# Patient Record
Sex: Male | Born: 1990 | Race: Black or African American | Hispanic: No | Marital: Married | Smoking: Never smoker
Health system: Southern US, Community
[De-identification: ages and names within clinical notes are randomized; demographics above are authoritative.]

## PROBLEM LIST (undated history)

## (undated) DIAGNOSIS — F819 Developmental disorder of scholastic skills, unspecified: Secondary | ICD-10-CM

## (undated) HISTORY — PX: NO PAST SURGERIES: SHX2092

## (undated) HISTORY — DX: Developmental disorder of scholastic skills, unspecified: F81.9

---
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
## 2015-04-28 ENCOUNTER — Ambulatory Visit: Payer: 59 | Admitting: Family Medicine

## 2015-05-08 ENCOUNTER — Encounter: Payer: Self-pay | Admitting: Family Medicine

## 2015-05-08 ENCOUNTER — Ambulatory Visit (INDEPENDENT_AMBULATORY_CARE_PROVIDER_SITE_OTHER): Payer: 59 | Admitting: Family Medicine

## 2015-05-08 VITALS — BP 118/82 | HR 61 | Temp 97.3°F | Ht 72.5 in | Wt 278.8 lb

## 2015-05-08 DIAGNOSIS — Z13220 Encounter for screening for lipoid disorders: Secondary | ICD-10-CM

## 2015-05-08 DIAGNOSIS — Z23 Encounter for immunization: Secondary | ICD-10-CM

## 2015-05-08 DIAGNOSIS — E669 Obesity, unspecified: Secondary | ICD-10-CM | POA: Diagnosis not present

## 2015-05-08 DIAGNOSIS — Z0000 Encounter for general adult medical examination without abnormal findings: Secondary | ICD-10-CM | POA: Diagnosis not present

## 2015-05-08 DIAGNOSIS — F70 Mild intellectual disabilities: Secondary | ICD-10-CM | POA: Insufficient documentation

## 2015-05-08 DIAGNOSIS — Z130 Encounter for screening for diseases of the blood and blood-forming organs and certain disorders involving the immune mechanism: Secondary | ICD-10-CM | POA: Diagnosis not present

## 2015-05-08 DIAGNOSIS — F79 Unspecified intellectual disabilities: Secondary | ICD-10-CM

## 2015-05-08 LAB — LIPID PANEL
CHOLESTEROL: 212 mg/dL — AB (ref 0–200)
HDL: 56 mg/dL (ref >39.00)
LDL Cholesterol: 146 mg/dL — ABNORMAL HIGH (ref 0–99)
NonHDL: 155.89
Total CHOL/HDL Ratio: 4
Triglycerides: 51 mg/dL (ref 0.0–149.0)
VLDL: 10.2 mg/dL (ref 0.0–40.0)

## 2015-05-08 LAB — CBC
HCT: 50.2 % (ref 39.0–52.0)
HEMOGLOBIN: 16.2 g/dL (ref 13.0–17.0)
MCHC: 32.2 g/dL (ref 30.0–36.0)
MCV: 78.7 fl (ref 78.0–100.0)
Platelets: 182 10*3/uL (ref 150.0–400.0)
RBC: 6.38 Mil/uL — ABNORMAL HIGH (ref 4.22–5.81)
RDW: 13.8 % (ref 11.5–15.5)
WBC: 5.9 10*3/uL (ref 4.0–10.5)

## 2015-05-08 LAB — COMPREHENSIVE METABOLIC PANEL
ALK PHOS: 60 U/L (ref 39–117)
ALT: 49 U/L (ref 0–53)
AST: 30 U/L (ref 0–37)
Albumin: 4.2 g/dL (ref 3.5–5.2)
BILIRUBIN TOTAL: 0.9 mg/dL (ref 0.2–1.2)
BUN: 19 mg/dL (ref 6–23)
CO2: 29 mEq/L (ref 19–32)
Calcium: 9.7 mg/dL (ref 8.4–10.5)
Chloride: 103 mEq/L (ref 96–112)
Creatinine, Ser: 1.06 mg/dL (ref 0.40–1.50)
GFR: 107.9 mL/min (ref >60.00)
GLUCOSE: 92 mg/dL (ref 70–99)
Potassium: 3.6 mEq/L (ref 3.5–5.1)
SODIUM: 139 meq/L (ref 135–145)
TOTAL PROTEIN: 7.2 g/dL (ref 6.0–8.3)

## 2015-05-08 LAB — HEMOGLOBIN A1C: HEMOGLOBIN A1C: 5.5 % (ref 4.6–6.5)

## 2015-05-08 NOTE — Assessment & Plan Note (Signed)
Influenza vaccine up-to-date. Tdap given today. Screening labs today: CBC, CMP, lipid, A1c.

## 2015-05-08 NOTE — Patient Instructions (Addendum)
It was nice to see you today.  Your exam was normal.  Follow up:  Return in about 27 year (around 27/05/2016).   Call with any concerns.  Take care  Dr. Adriana Simas  Health Maintenance, Male A healthy lifestyle and preventative care can promote health and wellness.  Maintain regular health, dental, and eye exams.  Eat a healthy diet. Foods like vegetables, fruits, whole grains, low-fat dairy products, and lean protein foods contain the nutrients you need and are low in calories. Decrease your intake of foods high in solid fats, added sugars, and salt. Get information about a proper diet from your health care provider, if necessary.  Regular physical exercise is one of the most important things you can do for your health. Most adults should get at least 150 minutes of moderate-intensity exercise (any activity that increases your heart rate and causes you to sweat) each week. In addition, most adults need muscle-strengthening exercises on 2 or more days a week.   Maintain a healthy weight. The body mass index (BMI) is a screening tool to identify possible weight problems. It provides an estimate of body fat based on height and weight. Your health care provider can find your BMI and can help you achieve or maintain a healthy weight. For males 20 years and older:  A BMI below 18.5 is considered underweight.  A BMI of 18.5 to 24.9 is normal.  A BMI of 25 to 29.9 is considered overweight.  A BMI of 30 and above is considered obese.  Maintain normal blood lipids and cholesterol by exercising and minimizing your intake of saturated fat. Eat a balanced diet with plenty of fruits and vegetables. Blood tests for lipids and cholesterol should begin at age 27 and be repeated every 5 years. If your lipid or cholesterol levels are high, you are over age 72, or you are at high risk for heart disease, you may need your cholesterol levels checked more frequently.Ongoing high lipid and cholesterol levels should  be treated with medicines if diet and exercise are not working.  If you smoke, find out from your health care provider how to quit. If you do not use tobacco, do not start.  Lung cancer screening is recommended for adults aged 55-80 years who are at high risk for developing lung cancer because of a history of smoking. A yearly low-dose CT scan of the lungs is recommended for people who have at least a 30-pack-year history of smoking and are current smokers or have quit within the past 15 years. A pack year of smoking is smoking an average of 1 pack of cigarettes a day for 1 year (for example, a 30-pack-year history of smoking could mean smoking 1 pack a day for 30 years or 2 packs a day for 15 years). Yearly screening should continue until the smoker has stopped smoking for at least 15 years. Yearly screening should be stopped for people who develop a health problem that would prevent them from having lung cancer treatment.  If you choose to drink alcohol, do not have more than 2 drinks per day. One drink is considered to be 12 oz (360 mL) of beer, 5 oz (150 mL) of wine, or 1.5 oz (45 mL) of liquor.  Avoid the use of street drugs. Do not share needles with anyone. Ask for help if you need support or instructions about stopping the use of drugs.  High blood pressure causes heart disease and increases the risk of stroke. High blood pressure  is more likely to develop in:  People who have blood pressure in the end of the normal range (100-139/85-89 mm Hg).  People who are overweight or obese.  People who are African American.  If you are 22-62 years of age, have your blood pressure checked every 3-5 years. If you are 17 years of age or older, have your blood pressure checked every year. You should have your blood pressure measured twice--once when you are at a hospital or clinic, and once when you are not at a hospital or clinic. Record the average of the two measurements. To check your blood pressure  when you are not at a hospital or clinic, you can use:  An automated blood pressure machine at a pharmacy.  A home blood pressure monitor.  If you are 75-75 years old, ask your health care provider if you should take aspirin to prevent heart disease.  Diabetes screening involves taking a blood sample to check your fasting blood sugar level. This should be done once every 3 years after age 1 if you are at a normal weight and without risk factors for diabetes. Testing should be considered at a younger age or be carried out more frequently if you are overweight and have at least 1 risk factor for diabetes.  Colorectal cancer can be detected and often prevented. Most routine colorectal cancer screening begins at the age of 76 and continues through age 43. However, your health care provider may recommend screening at an earlier age if you have risk factors for colon cancer. On a yearly basis, your health care provider may provide home test kits to check for hidden blood in the stool. A small camera at the end of a tube may be used to directly examine the colon (sigmoidoscopy or colonoscopy) to detect the earliest forms of colorectal cancer. Talk to your health care provider about this at age 39 when routine screening begins. A direct exam of the colon should be repeated every 5-10 years through age 104, unless early forms of precancerous polyps or small growths are found.  People who are at an increased risk for hepatitis B should be screened for this virus. You are considered at high risk for hepatitis B if:  You were born in a country where hepatitis B occurs often. Talk with your health care provider about which countries are considered high risk.  Your parents were born in a high-risk country and you have not received a shot to protect against hepatitis B (hepatitis B vaccine).  You have HIV or AIDS.  You use needles to inject street drugs.  You live with, or have sex with, someone who has  hepatitis B.  You are a man who has sex with other men (MSM).  You get hemodialysis treatment.  You take certain medicines for conditions like cancer, organ transplantation, and autoimmune conditions.  Hepatitis C blood testing is recommended for all people born from [birth year] through 1967 and any individual with known risk factors for hepatitis C.  Healthy men should no longer receive prostate-specific antigen (PSA) blood tests as part of routine cancer screening. Talk to your health care provider about prostate cancer screening.  Testicular cancer screening is not recommended for adolescents or adult males who have no symptoms. Screening includes self-exam, a health care provider exam, and other screening tests. Consult with your health care provider about any symptoms you have or any concerns you have about testicular cancer.  Practice safe sex. Use condoms and avoid high-risk  sexual practices to reduce the spread of sexually transmitted infections (STIs).  You should be screened for STIs, including gonorrhea and chlamydia if:  You are sexually active and are younger than 24 years.  You are older than 24 years, and your health care provider tells you that you are at risk for this type of infection.  Your sexual activity has changed since you were last screened, and you are at an increased risk for chlamydia or gonorrhea. Ask your health care provider if you are at risk.  If you are at risk of being infected with HIV, it is recommended that you take a prescription medicine daily to prevent HIV infection. This is called pre-exposure prophylaxis (PrEP). You are considered at risk if:  You are a man who has sex with other men (MSM).  You are a heterosexual man who is sexually active with multiple partners.  You take drugs by injection.  You are sexually active with a partner who has HIV.  Talk with your health care provider about whether you are at high risk of being infected with HIV. If  you choose to begin PrEP, you should first be tested for HIV. You should then be tested every 3 months for as long as you are taking PrEP.  Use sunscreen. Apply sunscreen liberally and repeatedly throughout the day. You should seek shade when your shadow is shorter than you. Protect yourself by wearing long sleeves, pants, a wide-brimmed hat, and sunglasses year round whenever you are outdoors.  Tell your health care provider of new moles or changes in moles, especially if there is a change in shape or color. Also, tell your health care provider if a mole is larger than the size of a pencil eraser.  A one-time screening for abdominal aortic aneurysm (AAA) and surgical repair of large AAAs by ultrasound is recommended for men aged 63-75 years who are current or former smokers.  Stay current with your vaccines (immunizations).   This information is not intended to replace advice given to you by your health care provider. Make sure you discuss any questions you have with your health care provider.   Document Released: 09/18/2007 Document Revised: 04/12/2014 Document Reviewed: 08/17/2010 Elsevier Interactive Patient Education [employer].

## 2015-05-08 NOTE — Progress Notes (Signed)
Subjective:  Patient ID: Johnny Rasmussen, male    DOB: Jan 23, 1991  Age: 25 y.o. MRN: [id number]  CC: Establish care.  HPI Johnny Rasmussen is a 25 y.o. male presents to the clinic today to establish care. No current concerns.  Preventative Healthcare  Immunizations  Tetanus - in need of.  Pneumococcal - not indicated.  Flu - up-to-date.  Labs: Screening labs today.   Exercise: No regular exercise.   Alcohol use: See below.  Smoking/tobacco use: No.   STD/HIV testing: Not sexually active.  PMH, Surgical Hx, Family Hx, Social History reviewed and updated as below.  Past Medical History  Diagnosis Date  . 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 No past surgeries     Family History  Problem Relation Age of Onset  . Hyperlipidemia Mother   . Hypertension Mother   . Diabetes Mother   . Drug abuse Paternal Uncle   . Hyperlipidemia Maternal Grandfather   . Diabetes Maternal Grandfather    Social History  Substance Use Topics  . Smoking status: Never Smoker   . Smokeless tobacco: Never Used  . Alcohol Use: 1.8 oz/week    3 Standard drinks or equivalent per week   Review of Systems  Respiratory: Positive for cough.   Gastrointestinal: Positive for diarrhea.  Neurological: Positive for dizziness.  Psychiatric/Behavioral:       Stress.   Objective:   Today's Vitals: BP 118/82 mmHg  Pulse 61  Temp(Src) 97.3 F (36.3 C) (Oral)  Ht 6' 0.5" (1.842 m)  Wt 278 lb 12 oz (126.44 kg)  BMI 37.27 kg/m2  SpO2 96%  Physical Exam  Constitutional: He is oriented to person, place, and time. He appears well-developed and well-nourished. No distress.  HENT:  Head: Normocephalic and atraumatic.  Nose: Nose normal.  Mouth/Throat: Oropharynx is clear and moist. No oropharyngeal exudate.  Normal TM's bilaterally.   Eyes: Conjunctivae are normal. No scleral icterus.  Neck: Neck supple.  No thyromegaly present.  Cardiovascular: Normal rate and regular rhythm.   No murmur heard. Pulmonary/Chest: Effort normal and breath sounds normal. He has no wheezes. He has no rales.  Abdominal: Soft. He exhibits no distension. There is no tenderness. There is no rebound and no guarding.  Musculoskeletal: Normal range of motion. He exhibits no edema.  Lymphadenopathy:    He has no cervical adenopathy.  Neurological: He is alert and oriented to person, place, and time.  Skin: Skin is warm and dry. No rash noted.  Psychiatric: He has a normal mood and affect.  Vitals reviewed.  Assessment & Plan:   Problem List Items Addressed This Visit    Intellectual disability   Preventative health care - Primary    Influenza vaccine up-to-date. Tdap given today. Screening labs today: CBC, CMP, lipid, A1c.       Other Visit Diagnoses    Screening, lipid        Relevant Orders    Lipid Profile    Comp Met (CMET)    Obesity (BMI 35.0-39.9 without comorbidity) ([place])        Relevant Orders    HgB A1c    Screening for deficiency anemia        Relevant Orders    CBC    Need for prophylactic vaccination with combined diphtheria-tetanus-pertussis (DTP) vaccine        Relevant Orders  Tdap vaccine greater than or equal to 7yo IM (Completed)       No outpatient encounter prescriptions on file as of 05/08/2015.   No facility-administered encounter medications on file as of 05/08/2015.    Follow-up: Return in about 1 year (around 05/07/2016).  [place]

## 2015-05-08 NOTE — Progress Notes (Signed)
Pre visit review using our clinic review tool, if applicable. No additional management support is needed unless otherwise documented below in the visit note. 

## 2019-01-18 ENCOUNTER — Other Ambulatory Visit: Payer: Self-pay

## 2019-01-18 ENCOUNTER — Emergency Department: Payer: Medicaid Other

## 2019-01-18 ENCOUNTER — Encounter: Payer: Self-pay | Admitting: Emergency Medicine

## 2019-01-18 ENCOUNTER — Emergency Department
Admission: EM | Admit: 2019-01-18 | Discharge: 2019-01-18 | Disposition: A | Discharge status: Home / Self Care | Payer: Medicaid Other | Attending: Emergency Medicine | Admitting: Emergency Medicine

## 2019-01-18 DIAGNOSIS — R0781 Pleurodynia: Secondary | ICD-10-CM | POA: Insufficient documentation

## 2019-01-18 MED ORDER — MELOXICAM 15 MG PO TABS: 15.0000 mg | ORAL_TABLET | Freq: Every day | ORAL | 0 refills | Status: DC

## 2019-01-18 MED ORDER — NAPROXEN 500 MG PO TABS
500.0000 mg | ORAL_TABLET | Freq: Once | ORAL | Status: AC
  Administered 2019-01-18: 18:00:00 500 mg via ORAL
  Filled 2019-01-18: qty 1

## 2019-01-18 NOTE — ED Triage Notes (Signed)
Pt in via POV, reports left rib pain x 3 weeks.  Pt reports working at [employer], states, "I dont know if I pulled something or what."  Denies any known injury.  NAD noted at this time.

## 2019-01-18 NOTE — ED Provider Notes (Signed)
[facility] Emergency Department Provider Note  ____________________________________________   First MD Initiated Contact with Patient 01/18/19 1754     (approximate)  I have reviewed the triage vital signs and the nursing notes.   HISTORY  Chief Complaint Chest Pain   HPI Johnny Rasmussen is a 28 y.o. male who presents to the emergency department for treatment and evaluation of pain in the left rib for the past 3 weeks.  No specific injury.  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 No relief with muscle rub cream.    Past Medical History:  Diagnosis Date  . 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 Intellectual disability 05/08/2015  . Preventative health care 05/08/2015    Past Surgical History:  Procedure Laterality Date  . NO PAST SURGERIES      Prior to Admission medications   Medication Sig Start Date End Date Taking? Authorizing Provider  meloxicam (MOBIC) 15 MG tablet Take 1 tablet (15 mg total) by mouth daily. 01/18/19   Chinita Pester, FNP    Allergies Patient has no known allergies.  Family History  Problem Relation Age of Onset  . Hyperlipidemia Mother   . Hypertension Mother   . Diabetes Mother   . Drug abuse Paternal Uncle   . Hyperlipidemia Maternal Grandfather   . Diabetes Maternal Grandfather     Social History Social History   Tobacco Use  . Smoking status: Never Smoker  . Smokeless tobacco: Never Used  Substance Use Topics  . Alcohol use: Yes    Alcohol/week: 3.0 standard drinks    Types: 3 Standard drinks or equivalent per week  . Drug use: No    Review of Systems  Constitutional: No fever/chills. Eyes: No visual changes. ENT: No sore throat. Cardiovascular: Positive for chest/left rib pain. Negative for pleuritic pain. Negative for palpitations. Negative for leg pain.  Respiratory: Negative for shortness of breath. Gastrointestinal: Negative for abdominal pain. no nausea, no vomiting.  No diarrhea.  No constipation. Genitourinary: Negative for dysuria. Musculoskeletal: Positive for back pain.  Skin: Negative for rash, lesion, wound. Neurological: Negative for headaches, focal weakness or numbness.   ____________________________________________   PHYSICAL EXAM:  VITAL SIGNS: ED Triage Vitals  Enc Vitals Group     BP 01/18/19 1737 (!) 171/96     Pulse Rate 01/18/19 1737 78     Resp 01/18/19 1737 16     Temp 01/18/19 1737 98.6 F (37 C)     Temp Source 01/18/19 1737 Oral     SpO2 01/18/19 1737 98 %     Weight 01/18/19 1741 260 lb (117.9 kg)     Height 01/18/19 1741 5\' 10"  (1.778 m)     Head Circumference --      Peak Flow --      Pain Score 01/18/19 1740 7     Pain Loc --      Pain Edu? --      Excl. in GC? --     Constitutional: Alert and oriented. Well appearing and in no acute distress. Normal mental status. Eyes: Conjunctivae are normal. PERRL. Head: Atraumatic. Nose: No congestion/rhinnorhea. Mouth/Throat: Mucous membranes are moist.  Oropharynx non-erythematous. Tongue normal in size and color. Neck: No stridor.  Hematological/Lymphatic/Immunilogical: No cervical lymphadenopathy. Cardiovascular: Normal rate, regular rhythm. Grossly normal heart sounds.  Good peripheral circulation. Respiratory: Normal respiratory effort.  No retractions. Lungs CTAB. Gastrointestinal: Soft and nontender. No distention. No abdominal bruits. No CVA tenderness. Genitourinary: Exam deferred. Musculoskeletal: No lower extremity tenderness. No edema of extremities. Neurologic:  Normal speech and language. No gross focal neurologic deficits are appreciated. Skin:  Skin is warm, dry and intact. No rash noted. Psychiatric: Mood and affect are normal. Speech and behavior are normal.  ____________________________________________   LABS (all labs ordered  are listed, but only abnormal results are displayed)  Labs Reviewed - No data to display ____________________________________________  EKG  ED ECG REPORT I, Lamaj Metoyer, FNP-BC personally viewed and interpreted this ECG.   Date: 01/18/2019  EKG Time: 1734  Rate: 86  Rhythm: normal EKG, normal sinus rhythm  Axis: normal  Intervals:none  ST&T Change: no ST elevation  ____________________________________________  RADIOLOGY  ED MD interpretation:  No acute findings on image of the left ribs and chest.  Official radiology report(s): Dg Ribs Unilateral W/chest Left  Result Date: 01/18/2019 CLINICAL DATA:  Left rib pain. Possible twisting injury. Symptoms for 3 weeks. EXAM: LEFT RIBS AND CHEST - 3+ VIEW COMPARISON:  None. FINDINGS: Frontal view of the chest and three views of left ribs. Frontal view of the chest demonstrates midline trachea. Normal heart size and mediastinal contours. Mildly low lung volumes. Clear lungs. Rib radiographs demonstrate a radiographic marker projecting over the tenth and ninth lateral left ribs. No displaced fracture. No focal osseous lesion. The last image is motion degraded. IMPRESSION: 1. No displaced rib fracture, pneumothorax, or other explanation for left rib pain. 2. No acute cardiopulmonary disease. Electronically Signed   By: Abigail Miyamoto M.D.   On: 01/18/2019 18:59    ____________________________________________   PROCEDURES  Procedure(s) performed: None  Procedures  Critical Care performed: No  ____________________________________________   INITIAL IMPRESSION / ASSESSMENT AND PLAN / ED COURSE  28 year old male presenting to the emergency department for treatment and evaluation of left side nontraumatic rib pain.  See HPI for further details.  Image of the left ribs and chest is negative for acute concerns.  Patient's pain is most likely related to repetitive motion.  He will be treated with meloxicam and advised to follow-up with his  primary care provider for symptoms that are not improving over the next week or so.  He was advised to return to the emergency department for symptoms that change or worsen if unable to schedule an appointment.     ____________________________________________   FINAL CLINICAL IMPRESSION(S) / ED DIAGNOSES  Final diagnoses:  Rib pain on left side     ED Discharge Orders         Ordered    meloxicam (MOBIC) 15 MG tablet  Daily     01/18/19 1920           Note:  This document was prepared using Dragon voice recognition software and may include unintentional dictation errors.   Victorino Dike, FNP 01/18/19 Suszanne Finch, MD 01/19/19 1251

## 2019-01-18 NOTE — Discharge Instructions (Signed)
Please follow-up with your primary care provider if not improving over the week. °Return to the emergency department for symptoms of change or worsen if you are unable to schedule an appointment. °

## 2020-01-24 ENCOUNTER — Emergency Department
Admission: EM | Admit: 2020-01-24 | Discharge: 2020-01-24 | Disposition: A | Discharge status: Home / Self Care | Payer: Medicaid Other | Attending: Emergency Medicine | Admitting: Emergency Medicine

## 2020-01-24 ENCOUNTER — Other Ambulatory Visit: Payer: Self-pay

## 2020-01-24 DIAGNOSIS — M79672 Pain in left foot: Secondary | ICD-10-CM | POA: Diagnosis not present

## 2020-01-24 DIAGNOSIS — M79671 Pain in right foot: Secondary | ICD-10-CM

## 2020-01-24 MED ORDER — MELOXICAM 15 MG PO TABS: 15.0000 mg | ORAL_TABLET | Freq: Every day | ORAL | 1 refills | Status: AC

## 2020-01-24 NOTE — ED Provider Notes (Signed)
[facility] Emergency Department Provider Note ____________________________________________  Time seen: Approximately 2:51 PM  I have reviewed the triage vital signs and the nursing notes.   HISTORY  Chief Complaint Foot Pain    HPI EIN RIJO is a 29 y.o. male who presents to the emergency department for evaluation and treatment of bilateral foot pain x 2 weeks. No injury or swelling.  Past Medical History:  Diagnosis Date  . 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 Intellectual disability 05/08/2015  . Preventative health care 05/08/2015    Past Surgical History:  Procedure Laterality Date  . NO PAST SURGERIES      Prior to Admission medications   Medication Sig Start Date End Date Taking? Authorizing Provider  meloxicam (MOBIC) 15 MG tablet Take 1 tablet (15 mg total) by mouth daily. 01/24/20   Chinita Pester, FNP    Allergies Patient has no known allergies.  Family History  Problem Relation Age of Onset  . Hyperlipidemia Mother   . Hypertension Mother   . Diabetes Mother   . Drug abuse Paternal Uncle   . Hyperlipidemia Maternal Grandfather   . Diabetes Maternal Grandfather     Social History Social History   Tobacco Use  . Smoking status: Never Smoker  . Smokeless tobacco: Never Used  Vaping Use  . Vaping Use: Never used  Substance Use Topics  . Alcohol use: Yes    Alcohol/week: 3.0 standard drinks    Types: 3 Standard drinks or equivalent per week  . Drug use: No    Review of Systems Constitutional: Negative for fever. Cardiovascular: Negative for chest pain. Respiratory: Negative for shortness of breath. Musculoskeletal: Positive for bilateral foot pain. Skin: Negative for open wounds.  Neurological: Negative for decrease in sensation  ____________________________________________   PHYSICAL EXAM:  VITAL SIGNS: ED Triage  Vitals  Enc Vitals Group     BP 01/24/20 1321 (!) 161/87     Pulse Rate 01/24/20 1321 70     Resp 01/24/20 1321 17     Temp 01/24/20 1321 97.7 F (36.5 C)     Temp Source 01/24/20 1321 Oral     SpO2 01/24/20 1321 100 %     Weight 01/24/20 1322 (!) 322 lb 1.6 oz (146.1 kg)     Height 01/24/20 1322 5\' 10"  (1.778 m)     Head Circumference --      Peak Flow --      Pain Score 01/24/20 1321 10     Pain Loc --      Pain Edu? --      Excl. in GC? --     Constitutional: Alert and oriented. Well appearing and in no acute distress. Eyes: Conjunctivae are clear without discharge or drainage Head: Atraumatic Neck: Supple Respiratory: No cough. Respirations are even and unlabored. Musculoskeletal: No swelling over the feet or ankles. DP and PT pulses are 2+. Left foot diffusely tender without obvious injury or deformity. No erythema or indication of gout. Neurologic: Motor and sensory function of the feet and toes intact. Skin: No open wounds or lesions. Psychiatric: Affect and behavior are appropriate.  ____________________________________________   LABS (all labs ordered are listed, but only abnormal results are displayed)  Labs Reviewed - No data to display ____________________________________________  RADIOLOGY  Not indicated  I, Quintarius Ferns, personally viewed and evaluated these images (plain radiographs)  as part of my medical decision making, as well as reviewing the written report by the radiologist.  No results found. ____________________________________________   PROCEDURES  Procedures  ____________________________________________   INITIAL IMPRESSION / ASSESSMENT AND PLAN / ED COURSE  Bayley L Steinruck is a 29 y.o. who presents to the emergency department for treatment and evaluation of bilateral foot pain x2 weeks without known injury.  Exam is overall reassuring.  He will be placed on meloxicam daily and encouraged to use warm Epson salt soaks after getting  off work.  If symptoms continue he is to follow-up with podiatry.  Medications - No data to display  Pertinent labs & imaging results that were available during my care of the patient were reviewed by me and considered in my medical decision making (see chart for details).   _________________________________________   FINAL CLINICAL IMPRESSION(S) / ED DIAGNOSES  Final diagnoses:  Foot pain, right  Foot pain, left    ED Discharge Orders         Ordered    meloxicam (MOBIC) 15 MG tablet  Daily        01/24/20 1544           If controlled substance prescribed during this visit, 12 month history viewed on the [facility] prior to issuing an initial prescription for Schedule II or III opiod.   Chinita Pester, FNP 01/24/20 1545    Chesley Noon, MD 01/25/20 1513

## 2020-01-24 NOTE — ED Triage Notes (Signed)
Pt c/o BL feet pain for the past 2 weeks, denies any swelling or injury, states he walks a lot with work.Marland Kitchen

## 2020-01-24 NOTE — Discharge Instructions (Signed)
Take the medication prescribed daily.  Use warm Epson salt soaks after work.  Follow-up with podiatry if not improving over the week.  Return to the emergency department for symptoms that change or worsen if you are unable to schedule an appointment with primary care or the podiatrist.

## 2021-01-22 IMAGING — CR DG RIBS W/ CHEST 3+V*L*
1 series · 4 of 4 positions shown · non-contrast
Comparison: None.

CLINICAL DATA: Left rib pain. Possible twisting injury. Symptoms
for 3 weeks.

EXAM:
LEFT RIBS AND CHEST - 3+ VIEW

[Series 1: dg ribs unilateral w/chest left · 0.14mm/px · 4 of 4 slices shown]
[im 1/4]
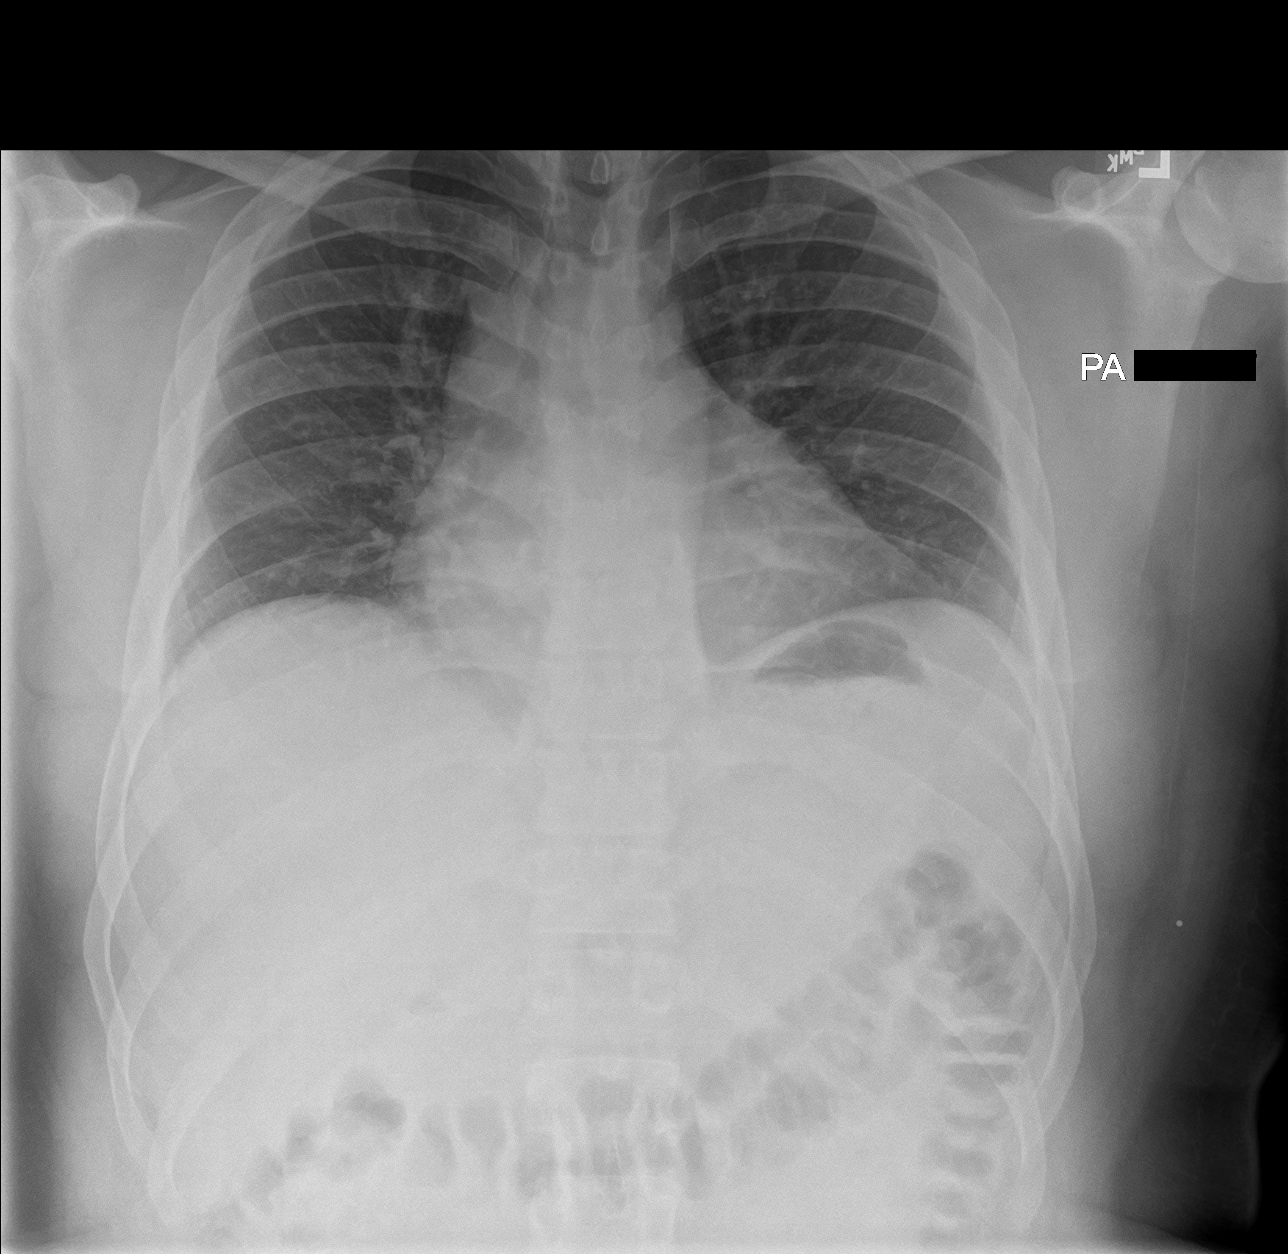
[im 2/4]
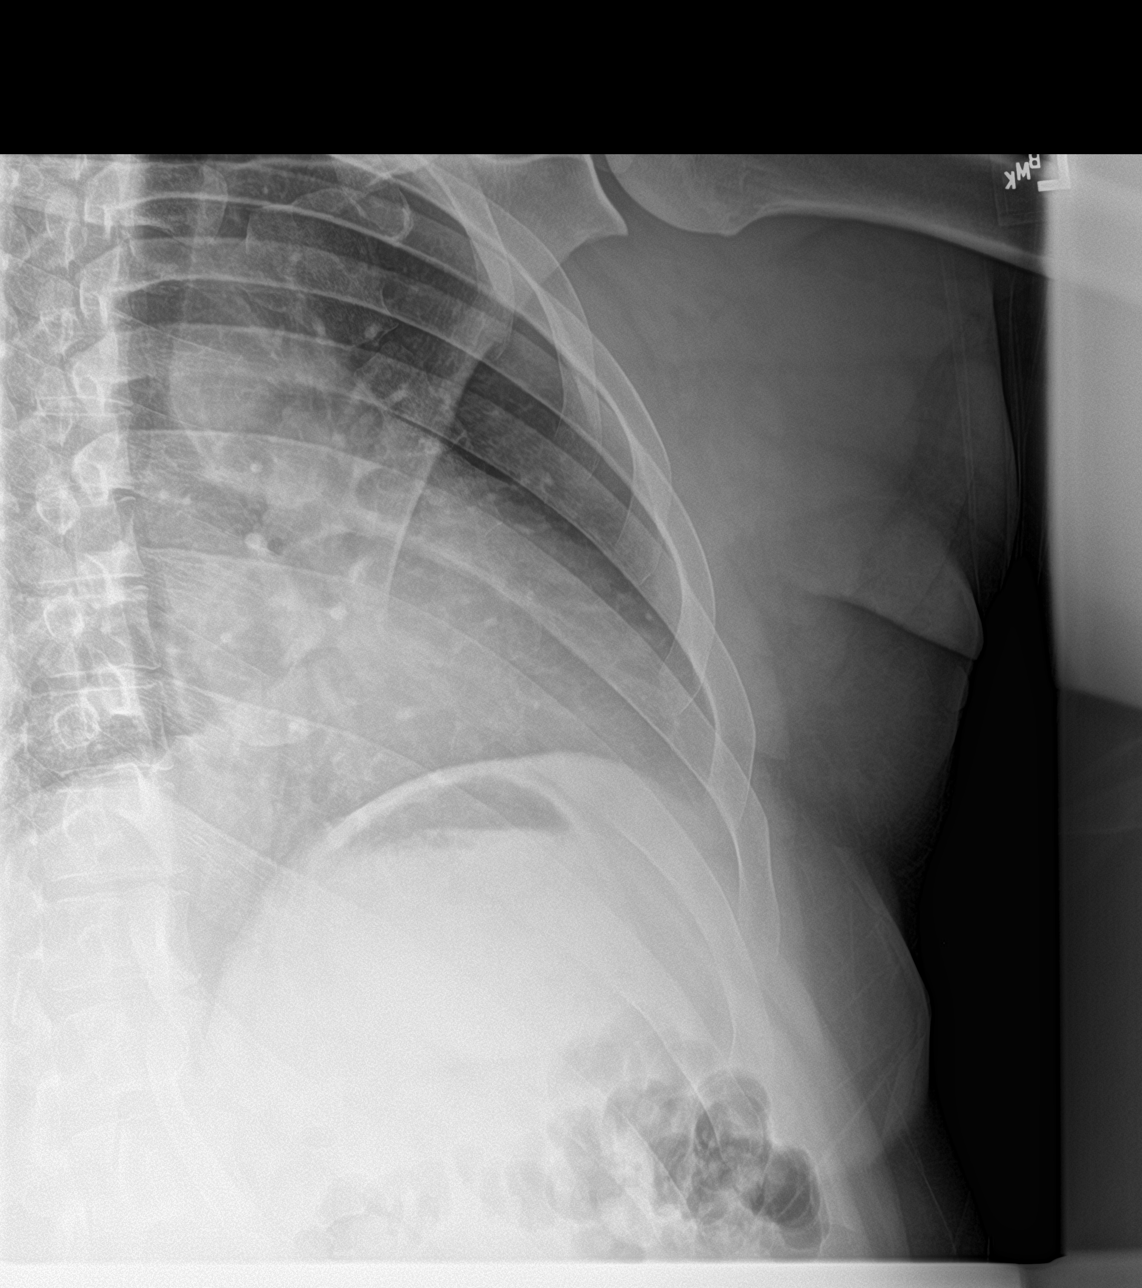
[im 3/4]
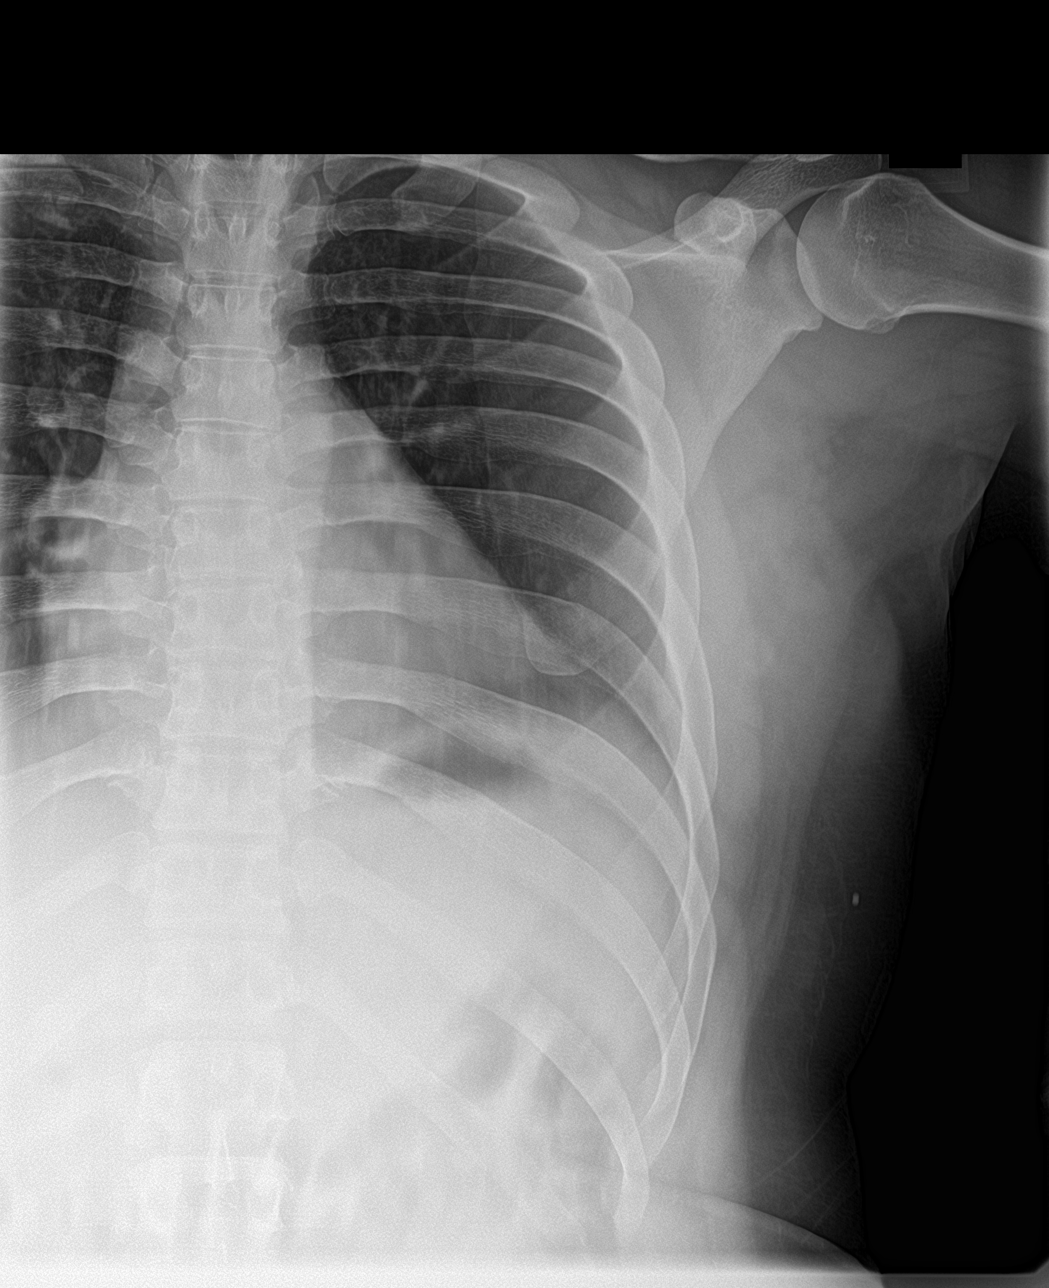
[im 4/4]
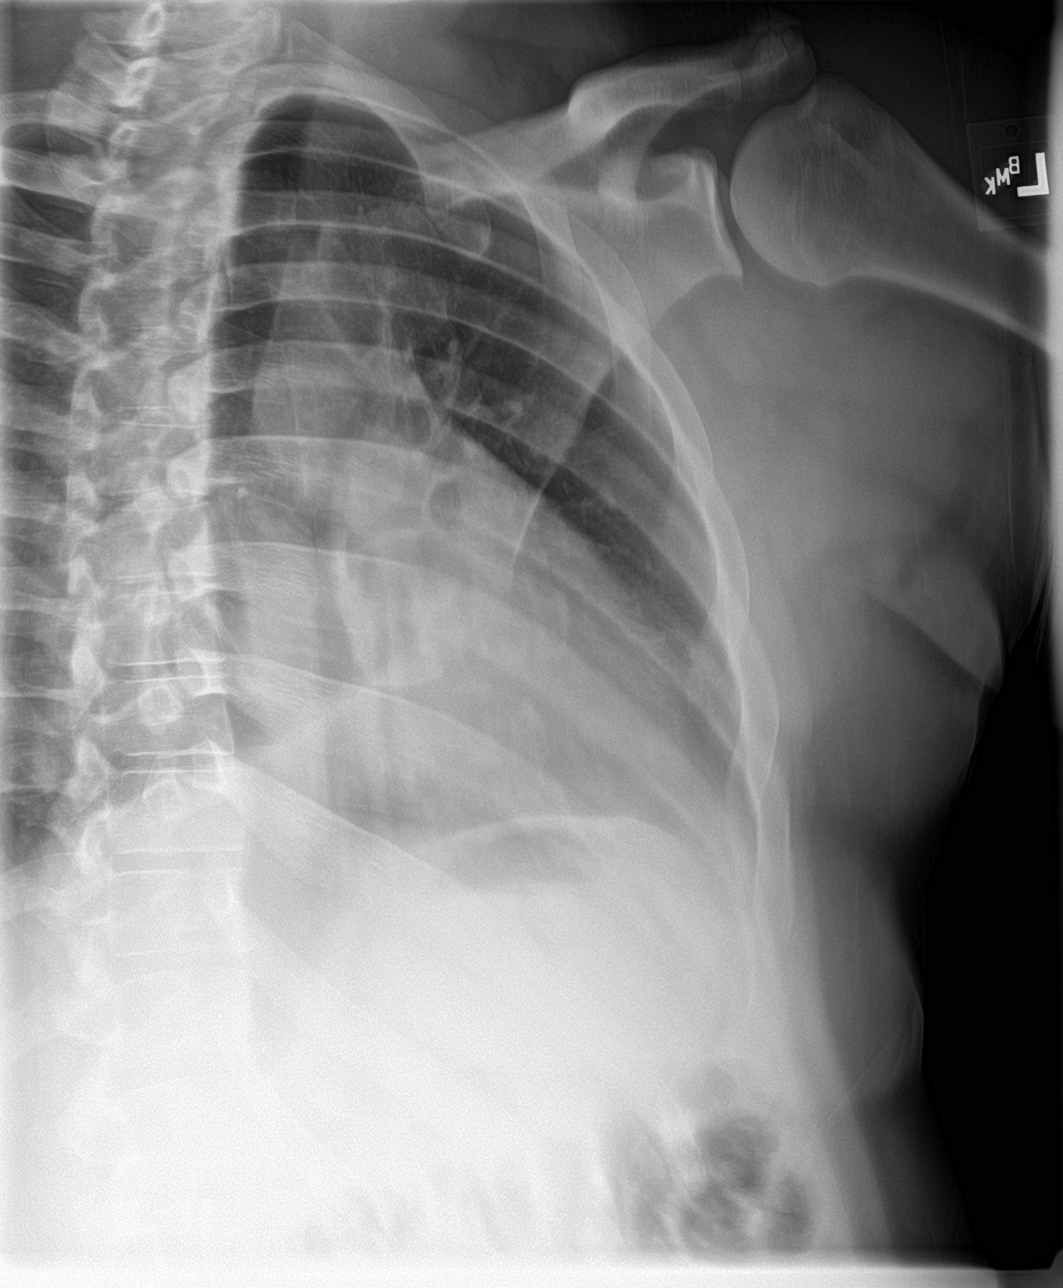

[4 of 4 positions shown; findings below may reference images not displayed]

FINDINGS: Frontal view of the chest and three views of left ribs. Frontal view
of the chest demonstrates midline trachea. Normal heart size and
mediastinal contours. Mildly low lung volumes. Clear lungs.

Rib radiographs demonstrate a radiographic marker projecting over
the tenth and ninth lateral left ribs. No displaced fracture. No
focal osseous lesion. The last image is motion degraded.
IMPRESSION: 1. No displaced rib fracture, pneumothorax, or other explanation for
left rib pain.
2. No acute cardiopulmonary disease.

## 2021-08-04 DIAGNOSIS — R0683 Snoring: Secondary | ICD-10-CM | POA: Insufficient documentation

## 2021-08-04 DIAGNOSIS — I10 Essential (primary) hypertension: Secondary | ICD-10-CM | POA: Insufficient documentation

## 2021-08-04 DIAGNOSIS — Z0000 Encounter for general adult medical examination without abnormal findings: Secondary | ICD-10-CM

## 2021-08-04 DIAGNOSIS — M79672 Pain in left foot: Secondary | ICD-10-CM | POA: Insufficient documentation

## 2021-08-04 HISTORY — DX: Pain in left foot: M79.672

## 2021-08-04 HISTORY — DX: Encounter for general adult medical examination without abnormal findings: Z00.00

## 2023-01-21 DIAGNOSIS — M109 Gout, unspecified: Secondary | ICD-10-CM | POA: Insufficient documentation

## 2023-02-25 DIAGNOSIS — H538 Other visual disturbances: Secondary | ICD-10-CM | POA: Insufficient documentation

## 2023-02-25 HISTORY — DX: Other visual disturbances: H53.8

## 2023-04-11 DIAGNOSIS — R7303 Prediabetes: Secondary | ICD-10-CM | POA: Insufficient documentation

## 2023-06-13 ENCOUNTER — Ambulatory Visit: Payer: MEDICAID | Attending: Otolaryngology

## 2023-06-13 DIAGNOSIS — G4733 Obstructive sleep apnea (adult) (pediatric): Secondary | ICD-10-CM | POA: Insufficient documentation

## 2023-07-21 ENCOUNTER — Telehealth: Payer: Self-pay | Admitting: Sleep Medicine

## 2023-07-21 NOTE — Telephone Encounter (Signed)
 Copied from CRM [id number]. Topic: Appointments - Scheduling Inquiry for Clinic >> Jul 21, 2023 11:14 AM Juliaette Ober wrote: Reason for CRM: patient is requesting a sleep study appointment. Please call patient.

## 2023-08-05 ENCOUNTER — Ambulatory Visit: Payer: MEDICAID | Admitting: Nurse Practitioner

## 2023-08-12 ENCOUNTER — Encounter: Payer: Self-pay | Admitting: Nurse Practitioner

## 2023-08-12 ENCOUNTER — Ambulatory Visit: Payer: MEDICAID | Admitting: Nurse Practitioner

## 2023-09-16 NOTE — Progress Notes (Signed)
 No show

## 2023-09-19 ENCOUNTER — Ambulatory Visit: Payer: Self-pay | Admitting: Internal Medicine

## 2023-11-04 ENCOUNTER — Encounter: Payer: Self-pay | Admitting: Sleep Medicine

## 2023-11-04 ENCOUNTER — Ambulatory Visit: Payer: MEDICAID | Admitting: Sleep Medicine

## 2023-11-04 VITALS — BP 122/80 | HR 74 | Temp 98.0°F | Ht 70.0 in | Wt 355.8 lb

## 2023-11-04 DIAGNOSIS — I10 Essential (primary) hypertension: Secondary | ICD-10-CM

## 2023-11-04 DIAGNOSIS — G4733 Obstructive sleep apnea (adult) (pediatric): Secondary | ICD-10-CM

## 2023-11-04 NOTE — Progress Notes (Addendum)
 Name:Johnny Rasmussen MRN: [id number] DOB: Feb 17, 1991   CHIEF COMPLAINT:  EXCESSIVE DAYTIME SLEEPINESS   HISTORY OF PRESENT ILLNESS:  Johnny Rasmussen is a 33 y.o. w/ a h/o HTN and morbid obesity who presents for c/o loud snoring, witnessed apnea and excessive daytime sleepiness which has been present for several years. Reports nocturnal awakenings due to nocturia, however does not have difficulty falling back to sleep. Denies any significant weight changes. Denies morning headaches, RLS symptoms, dream enactment, cataplexy, hypnagogic or hypnapompic hallucinations. Reports a family history of sleep apnea. Denies drowsy driving. Denies alcohol, tobacco or illicit drug use.   The patient was diagnosed with severe OSA (AHI 123, O2 74%) in March 2025.   Bedtime 12-2 am Sleep onset 10 mins Rise time 12 am   EPWORTH SLEEP SCORE 6    11/04/2023   10:00 AM  Results of the Epworth flowsheet  Sitting and reading 0  Watching TV 0  Sitting, inactive in a public place (e.g. a theatre or a meeting) 0  As a passenger in a car for an hour without a break 3  Lying down to rest in the afternoon when circumstances permit 3  Sitting and talking to someone 0  Sitting quietly after a lunch without alcohol 0  In a car, while stopped for a few minutes in traffic 0  Total score 6    PAST MEDICAL HISTORY :   has a past medical history of Blurred vision (02/25/2023), Foot pain, left (08/04/2021), Learning disability, and Routine history and physical examination of adult (08/04/2021).  has a past surgical history that includes No past surgeries. Prior to Admission medications   Medication Sig Start Date End Date Taking? Authorizing Provider  amLODipine (NORVASC) 10 MG tablet Take 10 mg by mouth daily. 02/25/23  Yes [provider]  losartan-hydrochlorothiazide (HYZAAR) 100-12.5 MG tablet Take 1 tablet by mouth daily. 09/14/21  Yes [provider]  meloxicam  (MOBIC ) 15 MG tablet Take 1  tablet (15 mg total) by mouth daily. 01/24/20  Yes Triplett, Cari B, FNP   No Known Allergies  FAMILY HISTORY:  family history includes Diabetes in his maternal grandfather and mother; Drug abuse in his paternal uncle; Hyperlipidemia in his maternal grandfather and mother; Hypertension in his mother. SOCIAL HISTORY:  reports that he has never smoked. He has never used smokeless tobacco. He reports current alcohol use of about 3.0 standard drinks of alcohol per week. He reports that he does not use drugs.   Review of Systems:  Gen:  Denies  fever, sweats, chills weight loss  HEENT: Denies blurred vision, double vision, ear pain, eye pain, hearing loss, nose bleeds, sore throat Cardiac:  No dizziness, chest pain or heaviness, chest tightness,edema, No JVD Resp:   No cough, -sputum production, -shortness of breath,-wheezing, -hemoptysis,  Gi: Denies swallowing difficulty, stomach pain, nausea or vomiting, diarrhea, constipation, bowel incontinence Gu:  Denies bladder incontinence, burning urine Ext:   Denies Joint pain, stiffness or swelling Skin: Denies  skin rash, easy bruising or bleeding or hives Endoc:  Denies polyuria, polydipsia , polyphagia or weight change Psych:   Denies depression, insomnia or hallucinations  Other:  All other systems negative  VITAL SIGNS: BP 122/80 (BP Location: Right Arm, Patient Position: Sitting, Cuff Size: Large)   Pulse 74   Temp 98 F (36.7 C) (Oral)   Ht 5' 10 (1.778 m)   Wt (!) 355 lb 12.8 oz (161.4 kg)   SpO2 97%  BMI 51.05 kg/m    Physical Examination:   General Appearance: No distress  EYES PERRLA, EOM intact.   NECK Supple, No JVD Pulmonary: normal breath sounds, No wheezing.  CardiovascularNormal S1,S2.  No m/r/g.   Abdomen: Benign, Soft, non-tender. Skin:   warm, no rashes, no ecchymosis  Extremities: normal, no cyanosis, clubbing. Neuro:without focal findings,  speech normal  PSYCHIATRIC: Mood, affect within normal  limits.   ASSESSMENT AND PLAN  OSA I suspect that OSA is likely present due to clinical presentation. Discussed the consequences of untreated sleep apnea. Advised not to drive drowsy for safety of patient and others. Will complete further evaluation with a home sleep study and follow up to review results.    HTN Stable, on current management. Following with PCP.   Morbid obesity Counseled patient on diet and lifestyle modification.    Patient  satisfied with Plan of action and management. All questions answered  I spent a total of 60 minutes reviewing chart data, face-to-face evaluation with the patient, counseling and coordination of care as detailed above.    Frimy Uffelman, M.D.  Sleep Medicine [facility]

## 2023-11-04 NOTE — Patient Instructions (Signed)
 Johnny Rasmussen

## 2024-02-06 ENCOUNTER — Ambulatory Visit: Payer: MEDICAID | Admitting: Sleep Medicine

## 2024-03-09 ENCOUNTER — Ambulatory Visit: Payer: MEDICAID | Admitting: Sleep Medicine

## 2024-03-19 ENCOUNTER — Ambulatory Visit: Payer: MEDICAID | Admitting: Sleep Medicine

## 2024-03-19 ENCOUNTER — Encounter: Payer: Self-pay | Admitting: Sleep Medicine

## 2024-03-19 VITALS — BP 140/80 | HR 80 | Temp 98.3°F | Ht 70.0 in | Wt 358.0 lb

## 2024-03-19 DIAGNOSIS — G4733 Obstructive sleep apnea (adult) (pediatric): Secondary | ICD-10-CM

## 2024-03-19 DIAGNOSIS — I10 Essential (primary) hypertension: Secondary | ICD-10-CM

## 2024-03-19 MED ORDER — ZEPBOUND 2.5 MG/0.5ML ~~LOC~~ SOAJ: 2.5000 mg | SUBCUTANEOUS | 1 refills | Status: AC

## 2024-03-19 NOTE — Patient Instructions (Addendum)

## 2024-03-19 NOTE — Progress Notes (Unsigned)
 Name:Seydou KLARK VANDERHOEF MRN: [id number] DOB: 12-Sep-1990   CHIEF COMPLAINT:  CPAP F/U   HISTORY OF PRESENT ILLNESS:  Mr. Staszak is a 33 y.o. w/ a h/o OSA, HTN and morbid obesity who presents for CPAP F/U visit. Reports using CPAP therapy almost every night, which is confirmed by compliance data. Reports feeling more refreshed upon awakening with CPAP therapy. States that he would like to try Zepbound  for weight loss.     EPWORTH SLEEP SCORE 6    11/04/2023   10:00 AM  Results of the Epworth flowsheet  Sitting and reading 0  Watching TV 0  Sitting, inactive in a public place (e.g. a theatre or a meeting) 0  As a passenger in a car for an hour without a break 3  Lying down to rest in the afternoon when circumstances permit 3  Sitting and talking to someone 0  Sitting quietly after a lunch without alcohol 0  In a car, while stopped for a few minutes in traffic 0  Total score 6    PAST MEDICAL HISTORY :   has a past medical history of Blurred vision (02/25/2023), Foot pain, left (08/04/2021), Learning disability, and Routine history and physical examination of adult (08/04/2021).  has a past surgical history that includes No past surgeries. Prior to Admission medications   Medication Sig Start Date End Date Taking? Authorizing Provider  amLODipine (NORVASC) 10 MG tablet Take 10 mg by mouth daily. 02/25/23  Yes [provider]  losartan-hydrochlorothiazide (HYZAAR) 100-12.5 MG tablet Take 1 tablet by mouth daily. 09/14/21  Yes [provider]  meloxicam  (MOBIC ) 15 MG tablet Take 1 tablet (15 mg total) by mouth daily. 01/24/20  Yes Triplett, Cari B, FNP   No Known Allergies  FAMILY HISTORY:  family history includes Diabetes in his maternal grandfather and mother; Drug abuse in his paternal uncle; Hyperlipidemia in his maternal grandfather and mother; Hypertension in his mother. SOCIAL HISTORY:  reports that he has never smoked. He has never used smokeless  tobacco. He reports current alcohol use of about 3.0 standard drinks of alcohol per week. He reports that he does not use drugs.   Review of Systems:  Gen:  Denies  fever, sweats, chills weight loss  HEENT: Denies blurred vision, double vision, ear pain, eye pain, hearing loss, nose bleeds, sore throat Cardiac:  No dizziness, chest pain or heaviness, chest tightness,edema, No JVD Resp:   No cough, -sputum production, -shortness of breath,-wheezing, -hemoptysis,  Gi: Denies swallowing difficulty, stomach pain, nausea or vomiting, diarrhea, constipation, bowel incontinence Gu:  Denies bladder incontinence, burning urine Ext:   Denies Joint pain, stiffness or swelling Skin: Denies  skin rash, easy bruising or bleeding or hives Endoc:  Denies polyuria, polydipsia , polyphagia or weight change Psych:   Denies depression, insomnia or hallucinations  Other:  All other systems negative  VITAL SIGNS: BP (!) 140/80   Pulse 80   Temp 98.3 F (36.8 C)   Ht 5' 10 (1.778 m)   Wt (!) 358 lb (162.4 kg)   SpO2 98%   BMI 51.37 kg/m    Physical Examination:   General Appearance: No distress  EYES PERRLA, EOM intact.   NECK Supple, No JVD Pulmonary: normal breath sounds, No wheezing.  CardiovascularNormal S1,S2.  No m/r/g.   Abdomen: Benign, Soft, non-tender. Skin:   warm, no rashes, no ecchymosis  Extremities: normal, no cyanosis, clubbing. Neuro:without focal findings,  speech normal  PSYCHIATRIC: Mood, affect  within normal limits.   ASSESSMENT AND PLAN  OSA Patient is using and benefiting from CPAP therapy. Counseled patient on increasing CPAP compliance. Discussed the consequences of untreated sleep apnea. Advised not to drive drowsy for safety of patient and others. Will follow up in 3 months.    HTN Stable, on current management. Following with PCP.   Morbid obesity Counseled patient on diet and lifestyle modification. Will also try patient on Zepbound  2.5 mg weekly.     Patient  satisfied with Plan of action and management. All questions answered  I spent a total of 22 minutes reviewing chart data, face-to-face evaluation with the patient, counseling and coordination of care as detailed above.    Leelynd Maldonado, M.D.  Sleep Medicine [facility]

## 2024-06-18 ENCOUNTER — Ambulatory Visit: Payer: MEDICAID | Admitting: Sleep Medicine
